# Patient Record
Sex: Female | Born: 1945 | Marital: Married | Smoking: Never smoker
Health system: Northeastern US, Community
[De-identification: ages and names within clinical notes are randomized; demographics above are authoritative.]

---

## 2004-09-06 ENCOUNTER — Emergency Department: Payer: Self-pay | Admitting: Neurology

## 2020-06-26 IMAGING — CR Mão
1 series · 1 of 1 positions shown · non-contrast
Comparison: none

[pa]
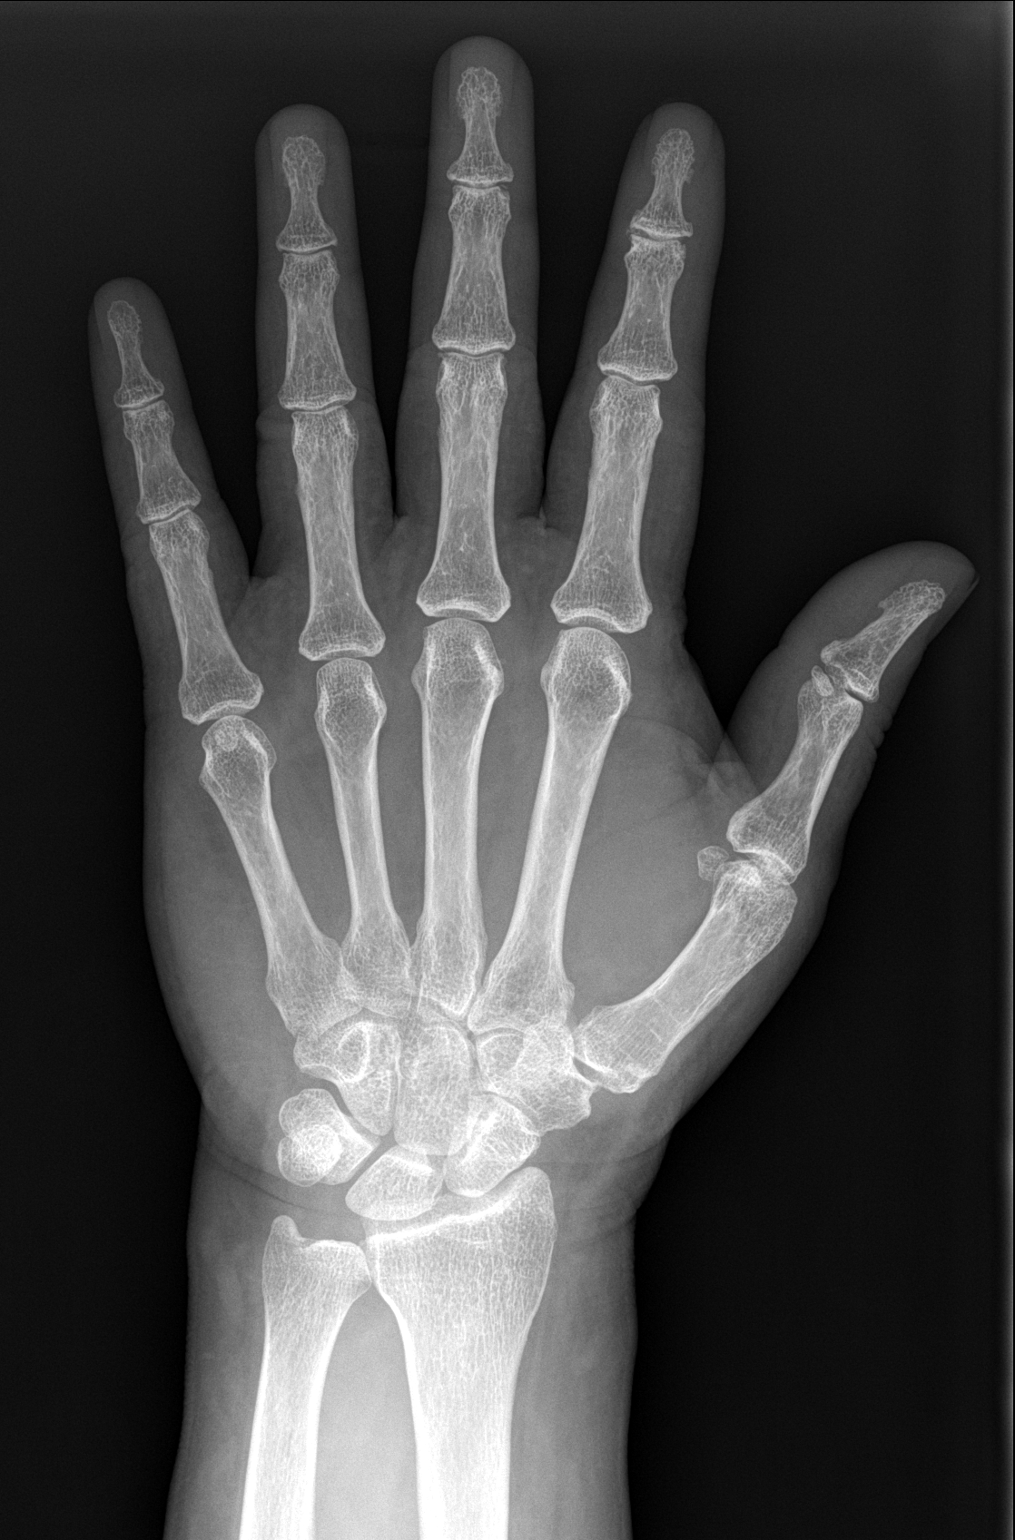

[1 of 1 positions shown; findings below may reference images not displayed]

RX MÃO ESQUERDA /  AP

Morfologia óssea normal.
Espaços e superfícies articulares conservados.
Estruturas ósseas alinhadas.
Osteofitose mínima das IFD's.
  Se  você  não  for  destinatário,  saiba  que  qualquer  divulgação,  cópia,  distribuição  ou  utilização  do  conteúdo  dessas 
informações é proibido e passível de punição dentro da lei.

## 2020-06-26 IMAGING — CR Joelho
2 series · 2 of 2 positions shown · non-contrast
Comparison: none

[ap]
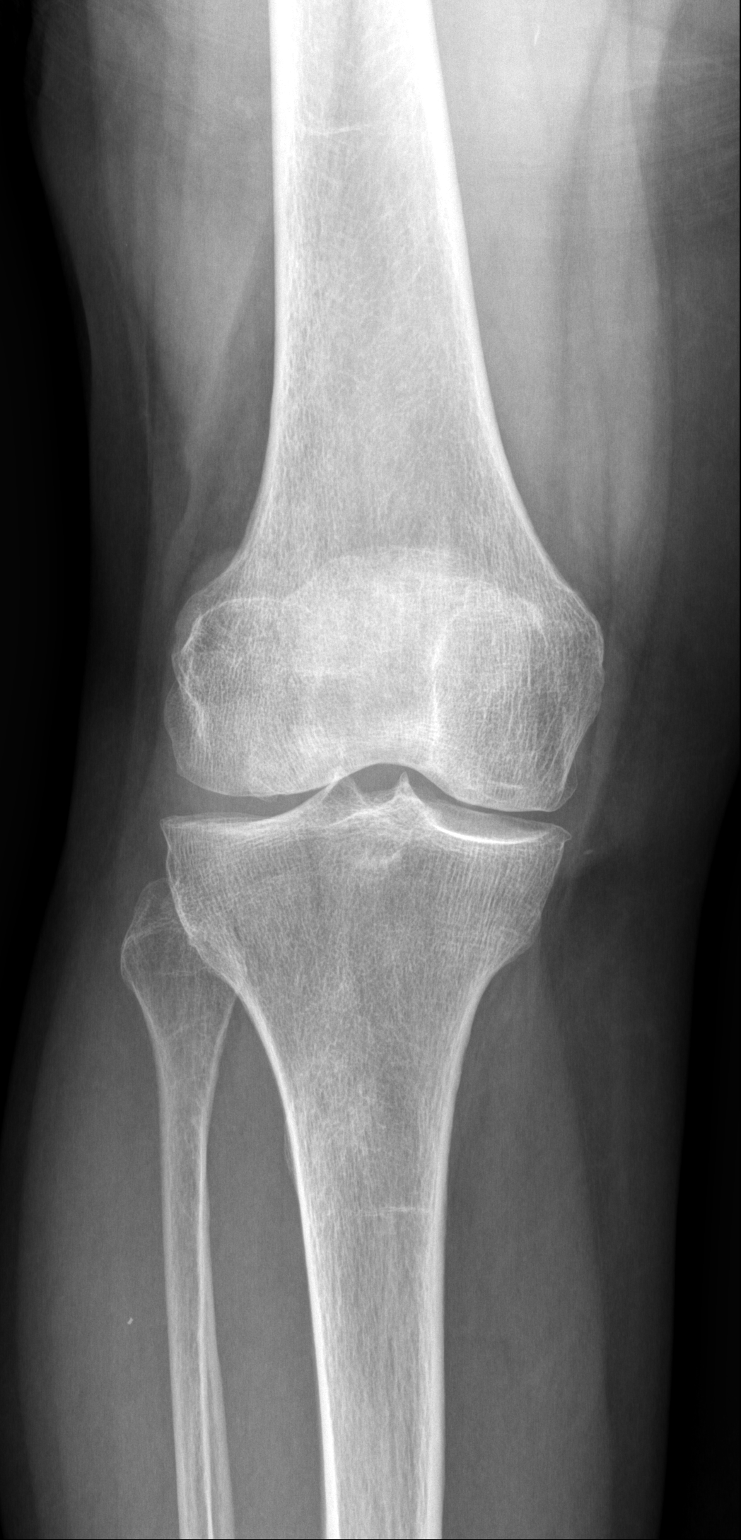

[lateral]
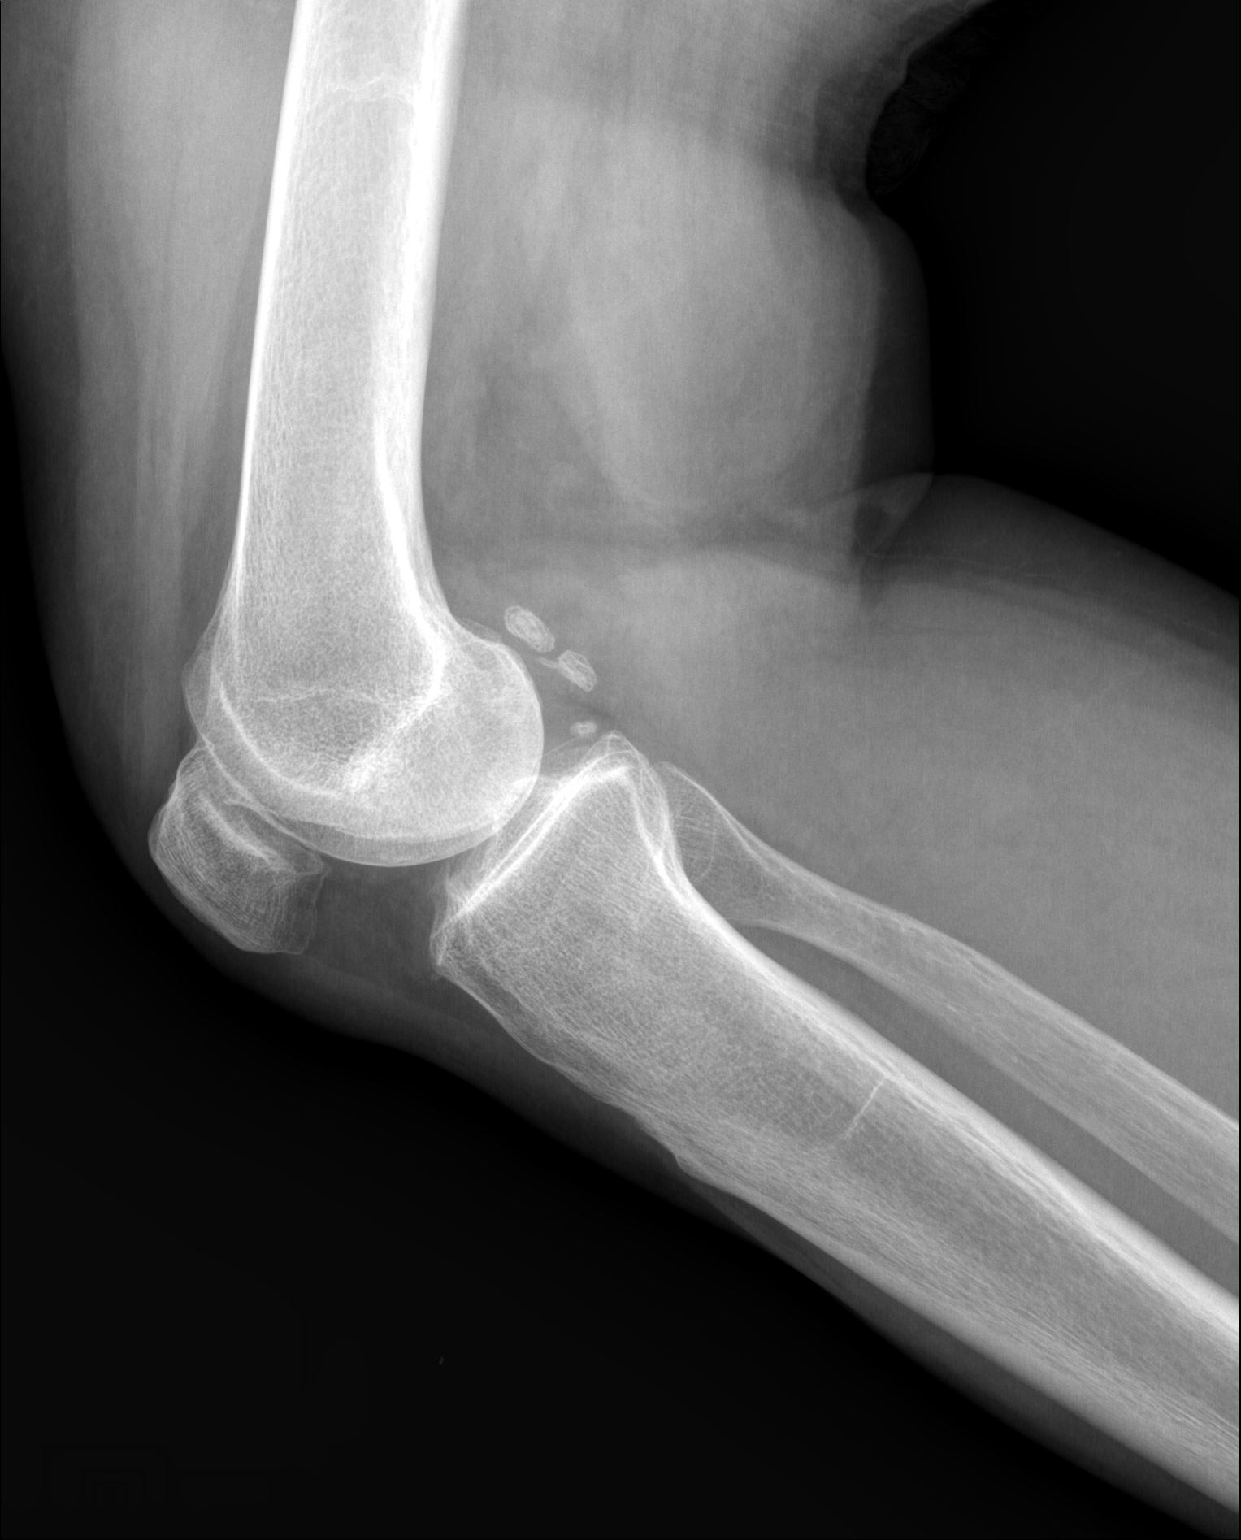

[2 of 2 positions shown; findings below may reference images not displayed]

RX JOELHO DIREITO/   AP - PERFIL

Morfologia óssea normal.
Espaços e superfícies articulares conservados.
Estruturas ósseas alinhadas.
Osteofitose mínima.

  Se  você  não  for  destinatário,  saiba  que  qualquer  divulgação,  cópia,  distribuição  ou  utilização  do  conteúdo  dessas 
informações é proibido e passível de punição dentro da lei.

## 2020-06-26 IMAGING — CR Punho
1 series · 1 of 1 positions shown · non-contrast
Comparison: none

[pa]
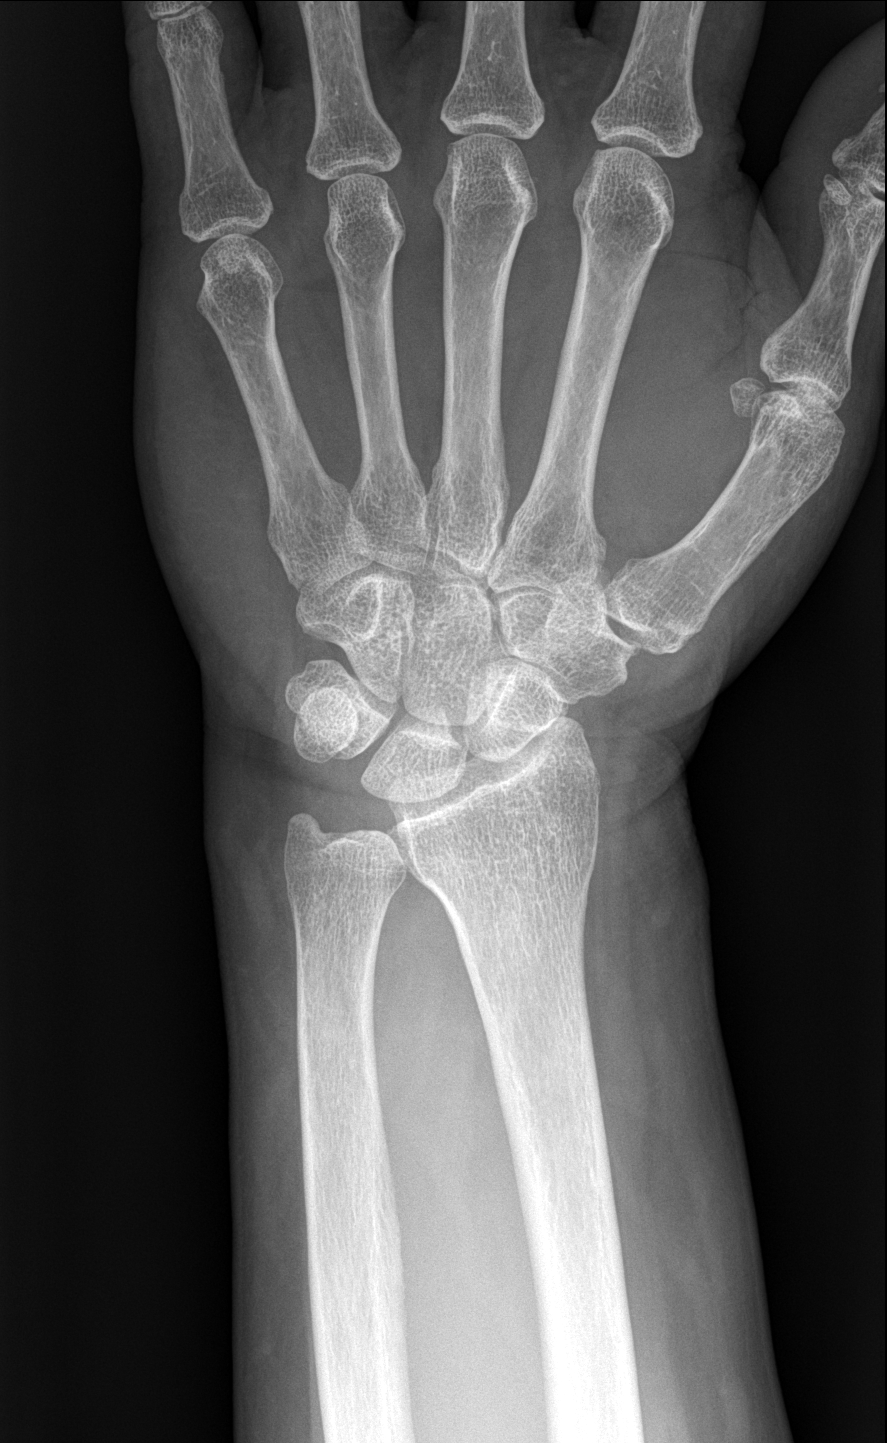

[1 of 1 positions shown; findings below may reference images not displayed]

[HOSPITAL] EXTERO

RX PUNHO ESQUERDO   [facility]

Morfologia óssea normal.
Espaços e superfícies articulares conservados.
Estruturas ósseas alinhadas.
  Se  você  não  for  destinatário,  saiba  que  qualquer  divulgação,  cópia,  distribuição  ou  utilização  do  conteúdo  dessas 
informações é proibido e passível de punição dentro da lei.

## 2020-06-26 IMAGING — CR Punho
1 series · 1 of 1 positions shown · non-contrast
Comparison: none

[pa]
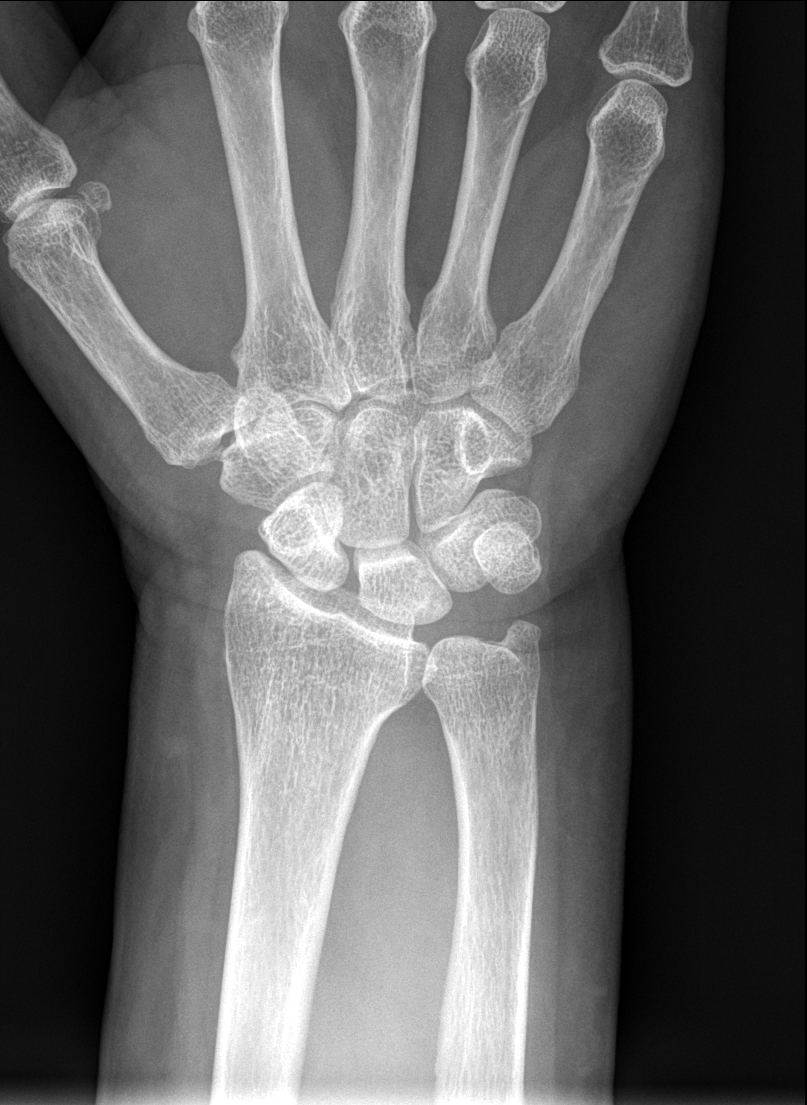

[1 of 1 positions shown; findings below may reference images not displayed]

RX PUNHO DIREITO   AP

Morfologia óssea normal.
Espaços e superfícies articulares conservados.
Estruturas ósseas alinhadas.
  Se  você  não  for  destinatário,  saiba  que  qualquer  divulgação,  cópia,  distribuição  ou  utilização  do  conteúdo  dessas 
informações é proibido e passível de punição dentro da lei.

## 2020-06-26 IMAGING — CR Mão
1 series · 1 of 1 positions shown · non-contrast
Comparison: none

[pa]
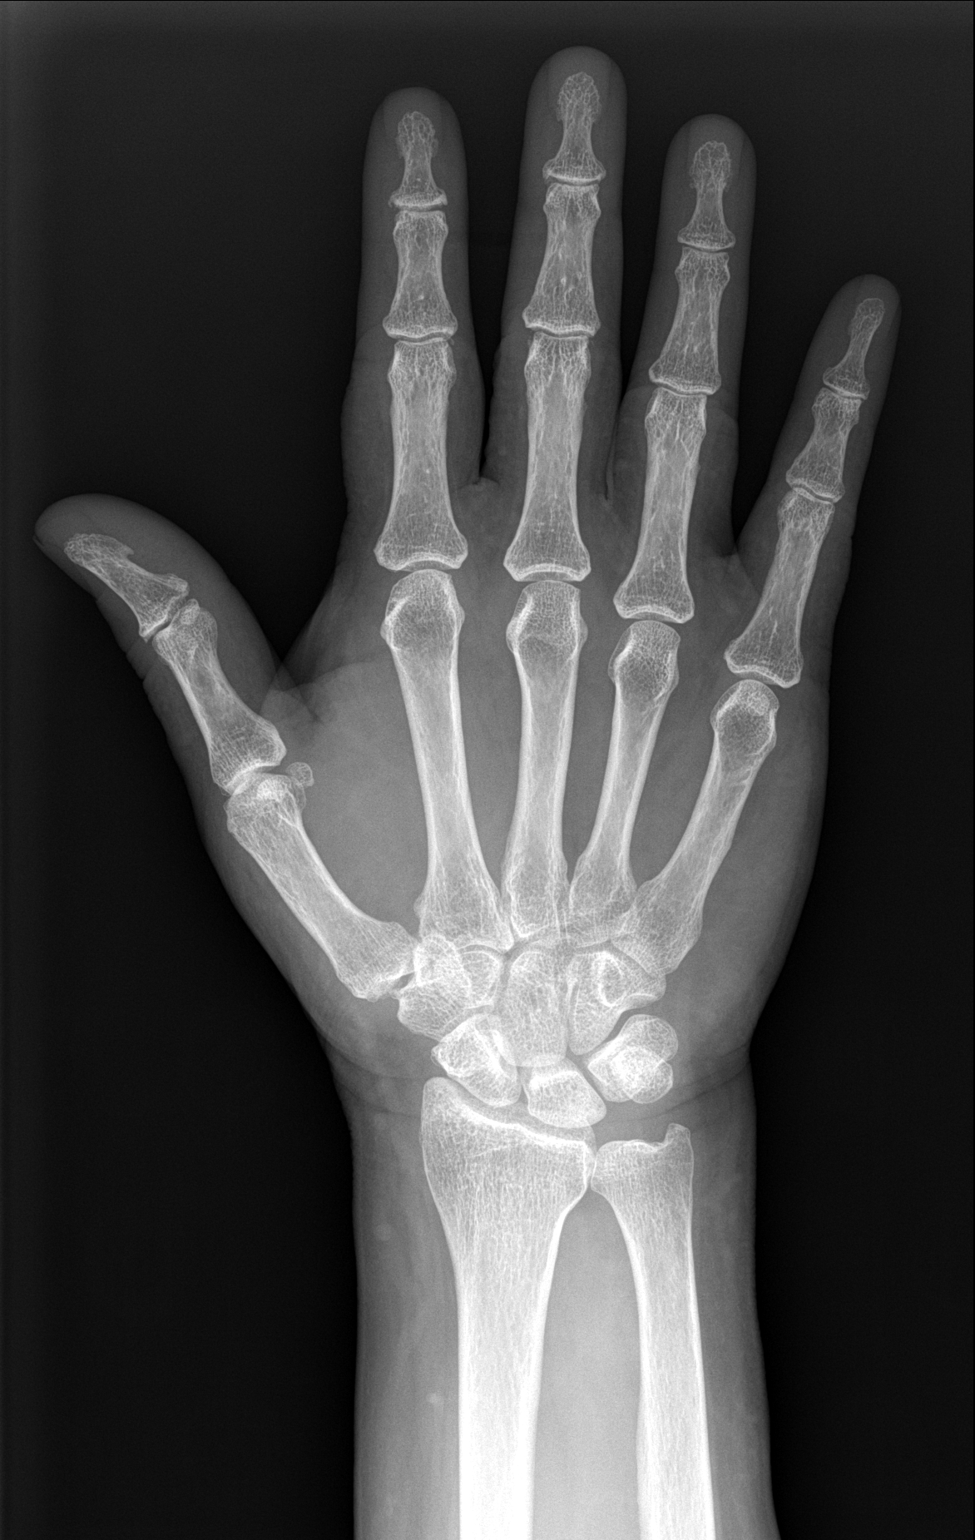

[1 of 1 positions shown; findings below may reference images not displayed]

RX MÃO DIREITA  /   AP

Morfologia óssea normal.
Espaços e superfícies articulares conservados.
Estruturas ósseas alinhadas.
Osteofitose mínima das IFD's e da artic. trapezio-6° metacarpiano.
  Se  você  não  for  destinatário,  saiba  que  qualquer  divulgação,  cópia,  distribuição  ou  utilização  do  conteúdo  dessas 
informações é proibido e passível de punição dentro da lei.

## 2020-06-26 IMAGING — CR Joelho
2 series · 2 of 2 positions shown · non-contrast
Comparison: none

[ap]
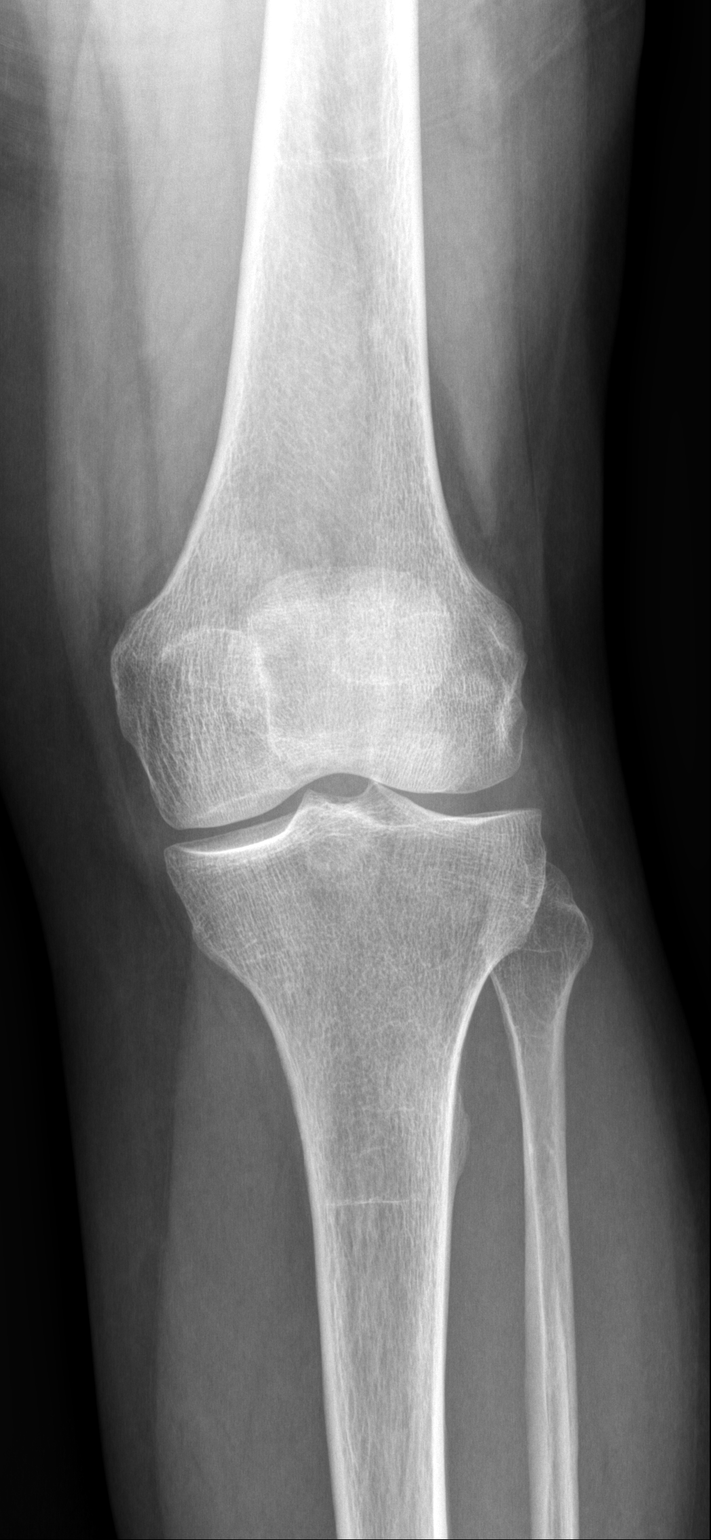

[lateral]
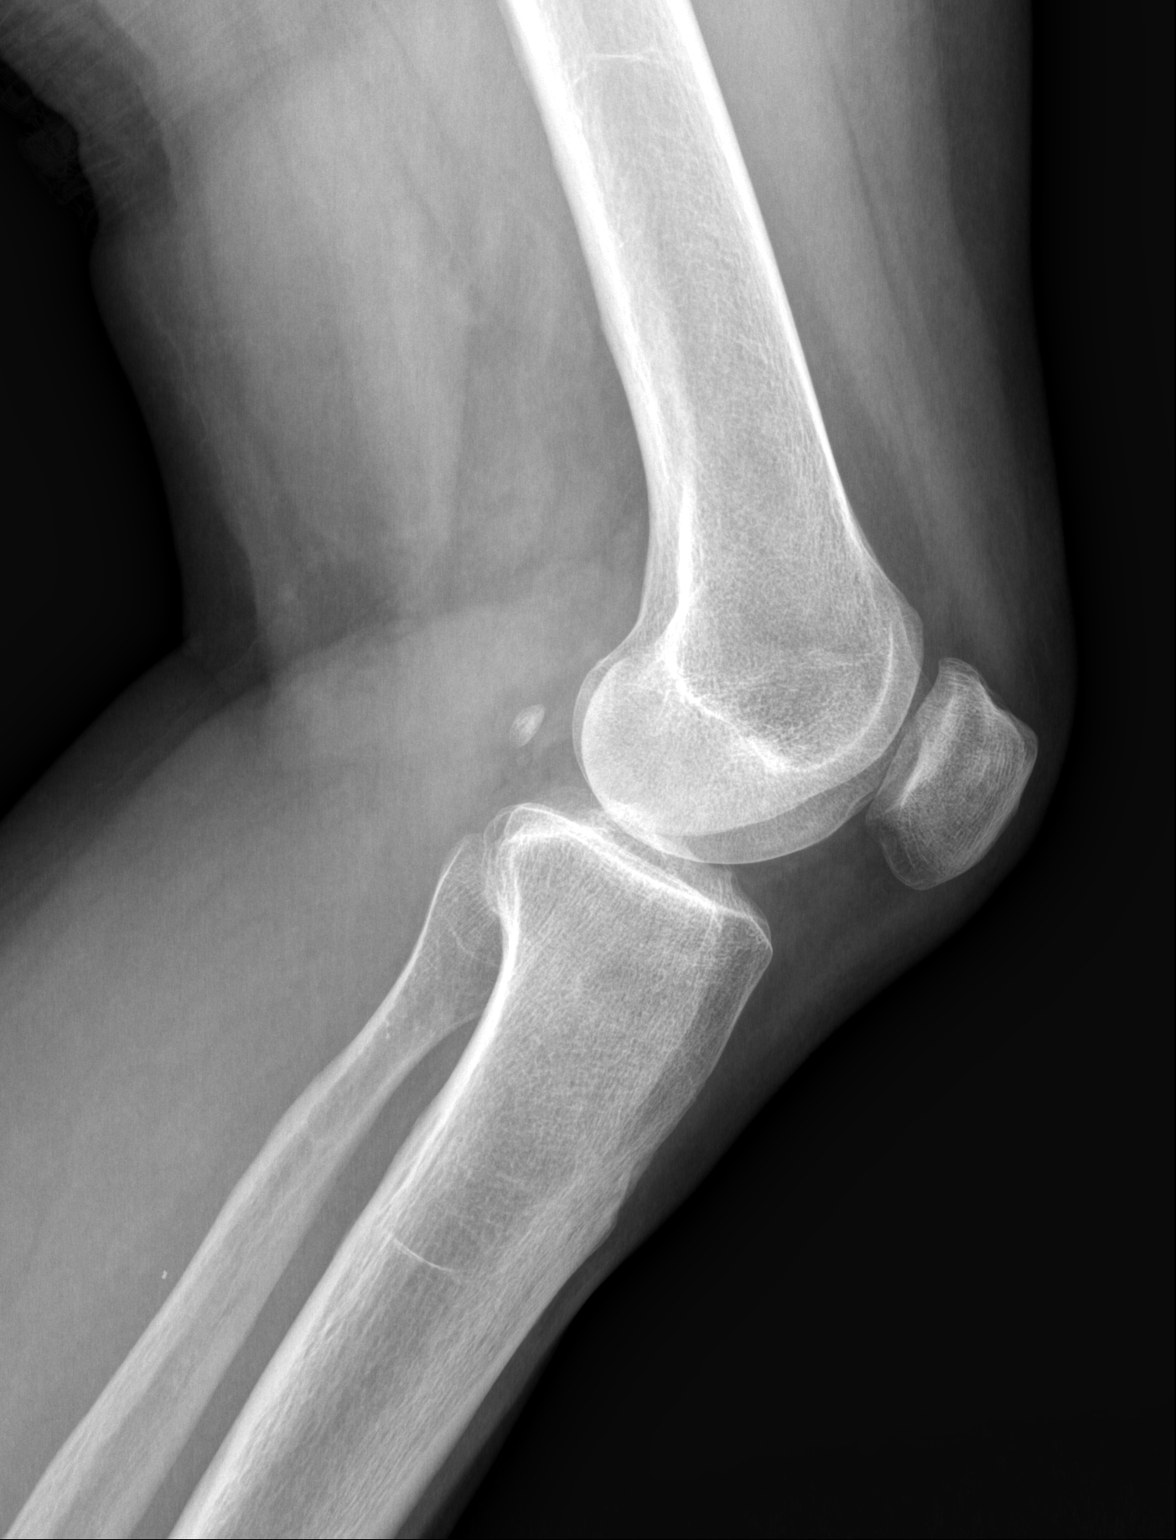

[2 of 2 positions shown; findings below may reference images not displayed]

RX JOELHO ESQUERDO /   AP - PERFIL

Morfologia óssea normal.
Espaços e superfícies articulares conservados.
Estruturas ósseas alinhadas.
  Se  você  não  for  destinatário,  saiba  que  qualquer  divulgação,  cópia,  distribuição  ou  utilização  do  conteúdo  dessas 
informações é proibido e passível de punição dentro da lei.

## 2021-10-02 ENCOUNTER — Emergency Department
Admission: AD | Admit: 2021-10-02 | Discharge: 2021-10-02 | Disposition: A | Discharge status: Home / Self Care | Payer: Commercial Managed Care - PPO | Source: Ambulatory Visit | Attending: Emergency Medicine | Admitting: Emergency Medicine

## 2021-10-02 ENCOUNTER — Other Ambulatory Visit: Payer: Self-pay

## 2021-10-02 ENCOUNTER — Encounter (HOSPITAL_BASED_OUTPATIENT_CLINIC_OR_DEPARTMENT_OTHER): Payer: Self-pay

## 2021-10-02 DIAGNOSIS — J020 Streptococcal pharyngitis: Secondary | ICD-10-CM | POA: Diagnosis not present

## 2021-10-02 LAB — RAPID STREP (POINT OF CARE): STREP SCREEN: POSITIVE — AB

## 2021-10-02 MED ORDER — PENICILLIN V POTASSIUM 500 MG PO TABS
500.00 mg | ORAL_TABLET | Freq: Two times a day (BID) | ORAL | 0 refills | Status: AC
Start: 2021-10-02 — End: 2021-10-12

## 2021-10-02 MED ORDER — PENICILLIN V POTASSIUM 250 MG PO TABS
500.00 mg | ORAL_TABLET | Freq: Once | ORAL | Status: AC
Start: 2021-10-02 — End: 2021-10-02
  Administered 2021-10-02: 500 mg via ORAL
  Filled 2021-10-02: qty 2

## 2021-10-02 NOTE — Discharge Instructions (Addendum)
You were seen and evaluated today urgent care with concern for sore throat and fevers.    You tested positive for strep which is a bacterial infection of the throat.  This is treated with antibiotics, you were given a first dose here in urgent care and a prescription for remaining doses to take as prescribed for the entire course.    Please use acetaminophen (Tylenol) and ibuprofen (Motrin) for pain control.   Take  acetaminophen (Tylenol) 650 MG (2 regular strength tablets) every 6 hours.   Take ibuprofen (Motrin/Advil)  600 MG (3 tablets) every 6 hours with food.     You can take them together every 6 hours OR alternate between Tylenol and Motrin by taking one medication every three hours. Wait 6 hours between the SAME kind of medication. For example: You could take tylenol at 12pm, then ibuprofen at 3pm, then tylenol again at 6pm.    Taking excessive amounts of these medications can cause serious and even life-threatening consequences, check the ingredients of any other medications you are taking (prescribed or over-the-counter) to avoid taking too much.     You may use throat lozenges and honey additionally to help soothe your throat.    Seek urgent medical attention if you develop new or worsening symptoms such as worsening swelling in your throat or difficulty swallowing or breathing.    Voc foi atendido e avaliado hoje atendimento de [place] com preocupao com dor de garganta e febre.    Voc testou positivo para estreptococo, que  uma infeco Investment banker, operational. Isso  tratado com antibiticos, voc recebeu uma primeira dose aqui no atendimento de urgncia e uma receita de doses restantes para tomar conforme prescrito para todo o curso.    Por favor, use paracetamol (Tylenol) e ibuprofeno (Motrin) para controle da dor.  Tome acetaminofeno (Tylenol) 650 MG (2 comprimidos de fora regular) a cada 6 horas.  Tome ibuprofeno (Motrin/Advil) 600 mg (3 comprimidos) a cada 6 horas com alimentos.    Voc pode  tom-los juntos a cada 6 horas OU alternar entre Tylenol e Motrin tomando um medicamento a cada trs horas. Esperar 6 horas entre o MESMO tipo de [place]. Por exemplo: voc pode tomar tylenol s 12h, depois ibuprofeno s 15h e depois tylenol novamente s 18h.    Tomar quantidades excessivas desses medicamentos pode causar consequncias graves e at [employer], verifique os ingredientes de qualquer outro medicamento que esteja tomando (prescrito ou sem receita) para Careers adviser.    Voc tambm pode usar pastilhas para a garganta e mel para ajudar a acalmar a garganta.    Procure atendimento mdico urgente se desenvolver sintomas novos ou agravados, como piora do inchao na garganta ou dificuldade para engolir ou Ambulance person.

## 2021-10-02 NOTE — UC Triage Notes (Signed)
Presents c/o arrived from [place] on Saturday. C/o sore throat and fever x 2 days. Patient is covid vaccinated. Home test negative yesterday. Tolerating PO. Nephew at same home with positive strep.

## 2021-10-02 NOTE — UC Provider Notes (Signed)
URGENT CARE PHYSICIAN ASSISTANT NOTE    Select prior medical records as available electronically through Epic were reviewed.  Patient interviewed with assistance of son at the bedside.    CHIEF COMPLAINT    Patient presents with:  Pharyngitis      HPI    Connie Flynn is a 76 year old female who presents accompanied by son with concern for sore throat x2 days and fever since yesterday.  Reports grandchild at home tested positive for strep.  Home COVID test was negative.      REVIEW OF SYSTEMS    Pertinent positives are reviewed in the HPI above.       Past Medical Hx:  History reviewed. No pertinent past medical history. Meds:  Current Facility-Administered Medications   Medication   . penicillin v potassium (VEETID) tablet 500 mg     Current Outpatient Medications   Medication Sig   . penicillin v potassium (VEETID) 500 MG tablet Take 1 tablet by mouth in the morning and 1 tablet before bedtime. Do all this for 10 days.         Past Surgical Hx:  History reviewed. No pertinent surgical history. Allergies:  Review of Patient's Allergies indicates:  No Known Allergies   Social Hx:  Social History    Tobacco Use      Smoking status: Never      Smokeless tobacco: Never    Vaping Use      Vaping status: Not on file    Alcohol use: Not on file   Immunizations:    There is no immunization history on file for this patient.         PHYSICAL EXAM      Vital Signs: BP 153/87   Pulse 86   Temp 97.3 F   Resp 18   Wt 72 kg (158 lb 11.7 oz)   SpO2 97%      Constitutional: No acute distress, cooperative, WD/WN, speaking full sentences.  HEENT: bilateral tonsillar erythema with mild edema without exudates, uvula midline, no trismus, tolerating secretions, no palpable cervical lymphadenopathy. Mucous membranes moist, hearing grossly intact  Neck: Supple, Normal range of motion   Pulmonary: Non-labored w/o retractions or accessory muscle use, or tripoding  Musculoskeletal:  Moving all 4 extremities, No major deformities  noted. Ambulatory with steady gait.  Neurological: AOx3, No focal sensory or motor deficits noted.   Psychiatric: Appropriate for age and situation.        Results:  Results for orders placed or performed during the hospital encounter of 10/02/21 (from the past 24 hour(s))   Rapid Strep [facility])    Collection Time: 10/02/21  2:10 PM   Result Value    STREP SCREEN Positive (A)    ONBOARD CONTROL PRESENT? Yes      Radiology:  No orders to display       Medications Given:  Medications   penicillin v potassium (VEETID) tablet 500 mg (has no administration in time range)         UC [place]      Patient is a 76 year old female who presented with concern for sore throat x2 days and fever since yesterday.  Known strep positive contact.    On exam bilateral tonsillar erythema with mild edema without exudates, uvula midline, no trismus, tolerating secretions, no palpable cervical lymphadenopathy.    Rapid strep test is positive, no evidence of peritonsillar abscess, low suspicion for deep tissue infection,  will treat patient with penicillin.    Stable for discharge home.  Return precautions reviewed with patient.    Follow Up: With PCP      Clinical Impression:  Strep pharyngitis    Disposition:   Discharge    Patient Condition:  Stable        Lucilla Lame, PA-C

## 2021-10-02 NOTE — Narrator Note (Signed)
Patient Disposition  Patient education for diagnosis, medications, activity, diet and follow-up.  Patient left UC 2:36 PM.  Patient rep received written instructions.    Interpreter to provide instructions: No    Patient belongings with patient: YES    Have all existing LDAs been addressed? N/A    Have all IV infusions been stopped? N/A    Destination: Home

## 2024-02-17 IMAGING — MR NEURO [HOSPITAL]^CRANIO
9 of 10 series · 35 of 48 positions shown · non-contrast
Comparison: none

[Series 2: T1 · sagittal · 4.5mm · 0.94mm/px · 5 of 30 slices shown (1 of 2)]
[im 1/30]
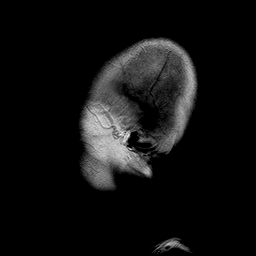
[im 8/30]
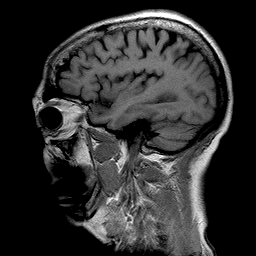
[im 15/30]
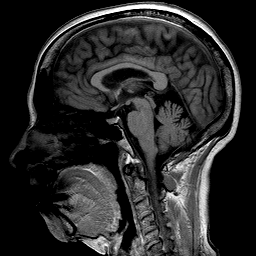
[im 22/30]
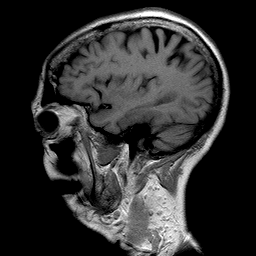
[im 30/30]
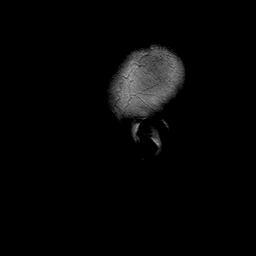

[Series 3: T2 · sagittal · 4.5mm · 0.94mm/px · 4 of 30 slices shown (1 of 4)]
[im 1/30]
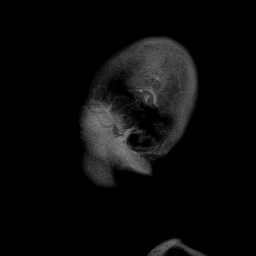
[im 10/30]
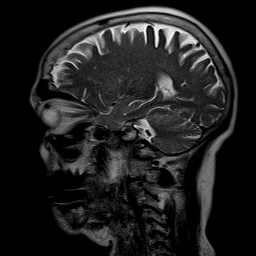
[im 20/30]
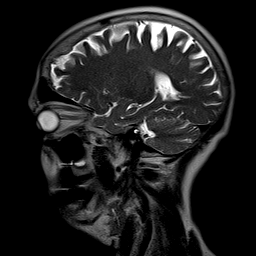
[im 30/30]
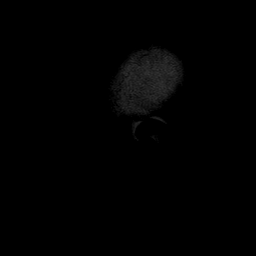

[Series 4: T2 · axial · 4.5mm · 0.94mm/px · z∈[-61,+90]mm · 3 of 26 slices shown (2 of 4)]
[im 1/26]
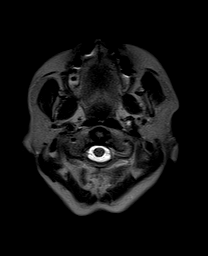
[im 13/26]
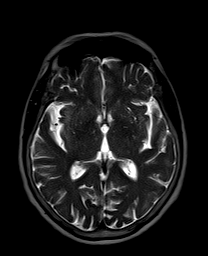
[im 26/26]
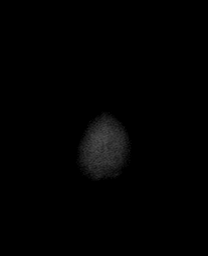

[Series 5: FLAIR · axial · 4.5mm · 0.90mm/px · z∈[-61,+91]mm · 3 of 26 slices shown]
[im 1/26]
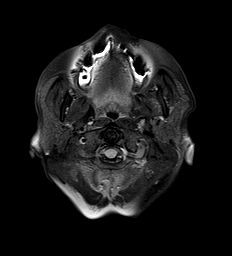
[im 13/26]
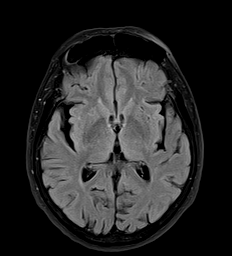
[im 26/26]
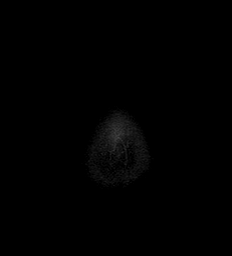

[Series 6: T2 · axial · 4.5mm · 0.45mm/px · z∈[-61,+91]mm · 3 of 26 slices shown (3 of 4)]
[im 1/26]
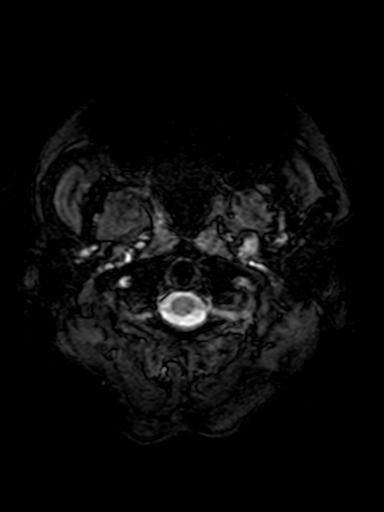
[im 13/26]
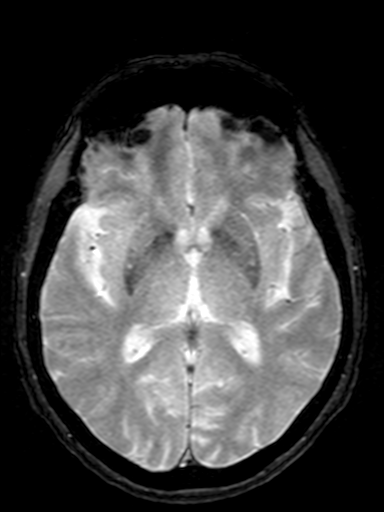
[im 26/26]
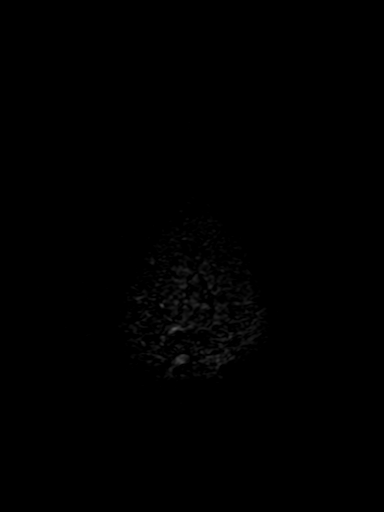

[Series 7: axial difusao_tracew · axial · 4.5mm · 1.31mm/px · z∈[-61,+91]mm · 7 of 52 slices shown]
[im 1/52]
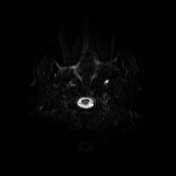
[im 9/52]
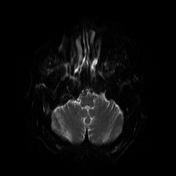
[im 18/52]
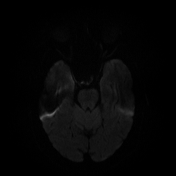
[im 26/52]
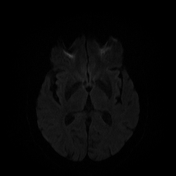
[im 35/52]
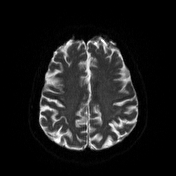
[im 43/52]
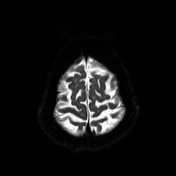
[im 52/52]
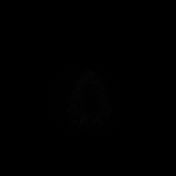

[Series 8: axial difusao_adc · axial · 4.5mm · 1.31mm/px · z∈[-61,+91]mm · 3 of 26 slices shown]
[im 1/26]
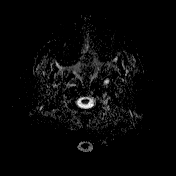
[im 13/26]
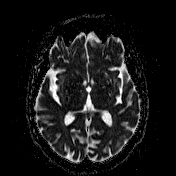
[im 26/26]
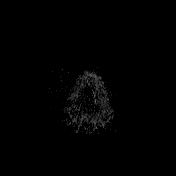

[Series 9: T2 · coronal · 4.5mm · 0.94mm/px · 4 of 30 slices shown (4 of 4)]
[im 1/30]
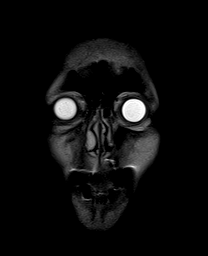
[im 10/30]
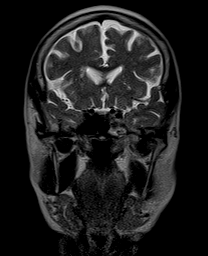
[im 20/30]
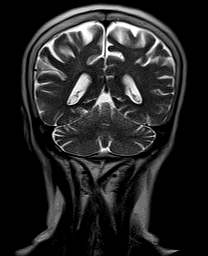
[im 30/30]
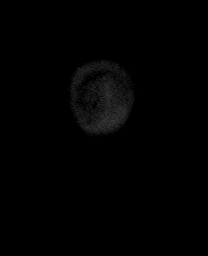

[Series 10: T1 · axial · 4.5mm · 0.94mm/px · z∈[-61,+90]mm · 3 of 26 slices shown (2 of 2)]
[im 1/26]
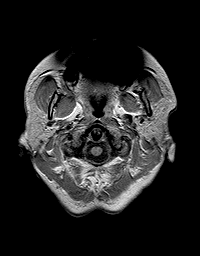
[im 13/26]
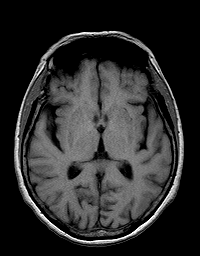
[im 26/26]
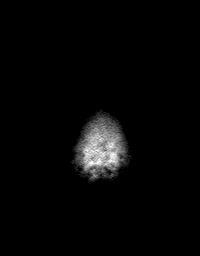

[35 of 48 positions shown; findings below may reference images not displayed]

Método:
Realizado estudo de ressonância magnética de crânio com técnica FSE, com sequências ponderadas em T1 e em T2,
sem a infusão endovenosa do meio de contraste paramagnético.
Realizada sequência ecoplanar ponderada em difusão.

Análise:
Focos de hipersinal em T2 e FLAIR na substância branca dos hemisférios cerebrais, sem efeito expansivo.
RESSONÂNCIA MAGNÉTICA DO CRANIO
Restante do parênquima encefálico com morfologia e intensidade de sinal preservados.
Não há sinais de isquemia aguda / subaguda precoce na sequência difusão.

Acentuação dos sulcos e ﬁssuras encefálicos com ectasia compensatória do sistema ventricular.
Não há sinais de comprometimento volumétrico seletivo dos hipocampos, à análise qualitativa.
Principais troncos arteriais intracranianos com sinal de ﬂuxo preservado segundo critérios spin Echo.

Impressão Diagnóstica:
Focos de alteração de sinal na substância branca dos hemisférios cerebrais, podendo representar focos de gliose
relacionados a microangiopatia.
Alteração volumétrica encefálica (habitual para a faixa etária).
O restante do exame não demonstra alterações signiﬁcativas.
